# Patient Record
Sex: Male | Born: 1960 | Hispanic: No | Marital: Married | Smoking: Never smoker
Health system: Southern US, Community
[De-identification: ages and names within clinical notes are randomized; demographics above are authoritative.]

## PROBLEM LIST (undated history)

## (undated) DIAGNOSIS — I10 Essential (primary) hypertension: Secondary | ICD-10-CM

---

## 2006-09-28 ENCOUNTER — Ambulatory Visit: Payer: Self-pay | Admitting: Orthopedic Surgery

## 2008-06-26 IMAGING — CR ORBITS FOR FOREIGN BODY - 2 VIEW
1 series · 3 of 3 positions shown · non-contrast
Comparison: none

REASON FOR EXAM: Eval for metallic foreign body prior to MRI
COMMENTS:

PROCEDURE:     DXR - DXR ORBITS FOR MRI CLEARANCE  - September 28, 2006  [DATE]
RESULT:     Three views of the orbits were obtained prior to MRI
examination. No orbital foreign body or other significant adenopathy is seen
that would preclude MRI examination.

[Series 1: view not recorded · 0.17mm/px · 3 of 3 slices shown]
[im 1/3]
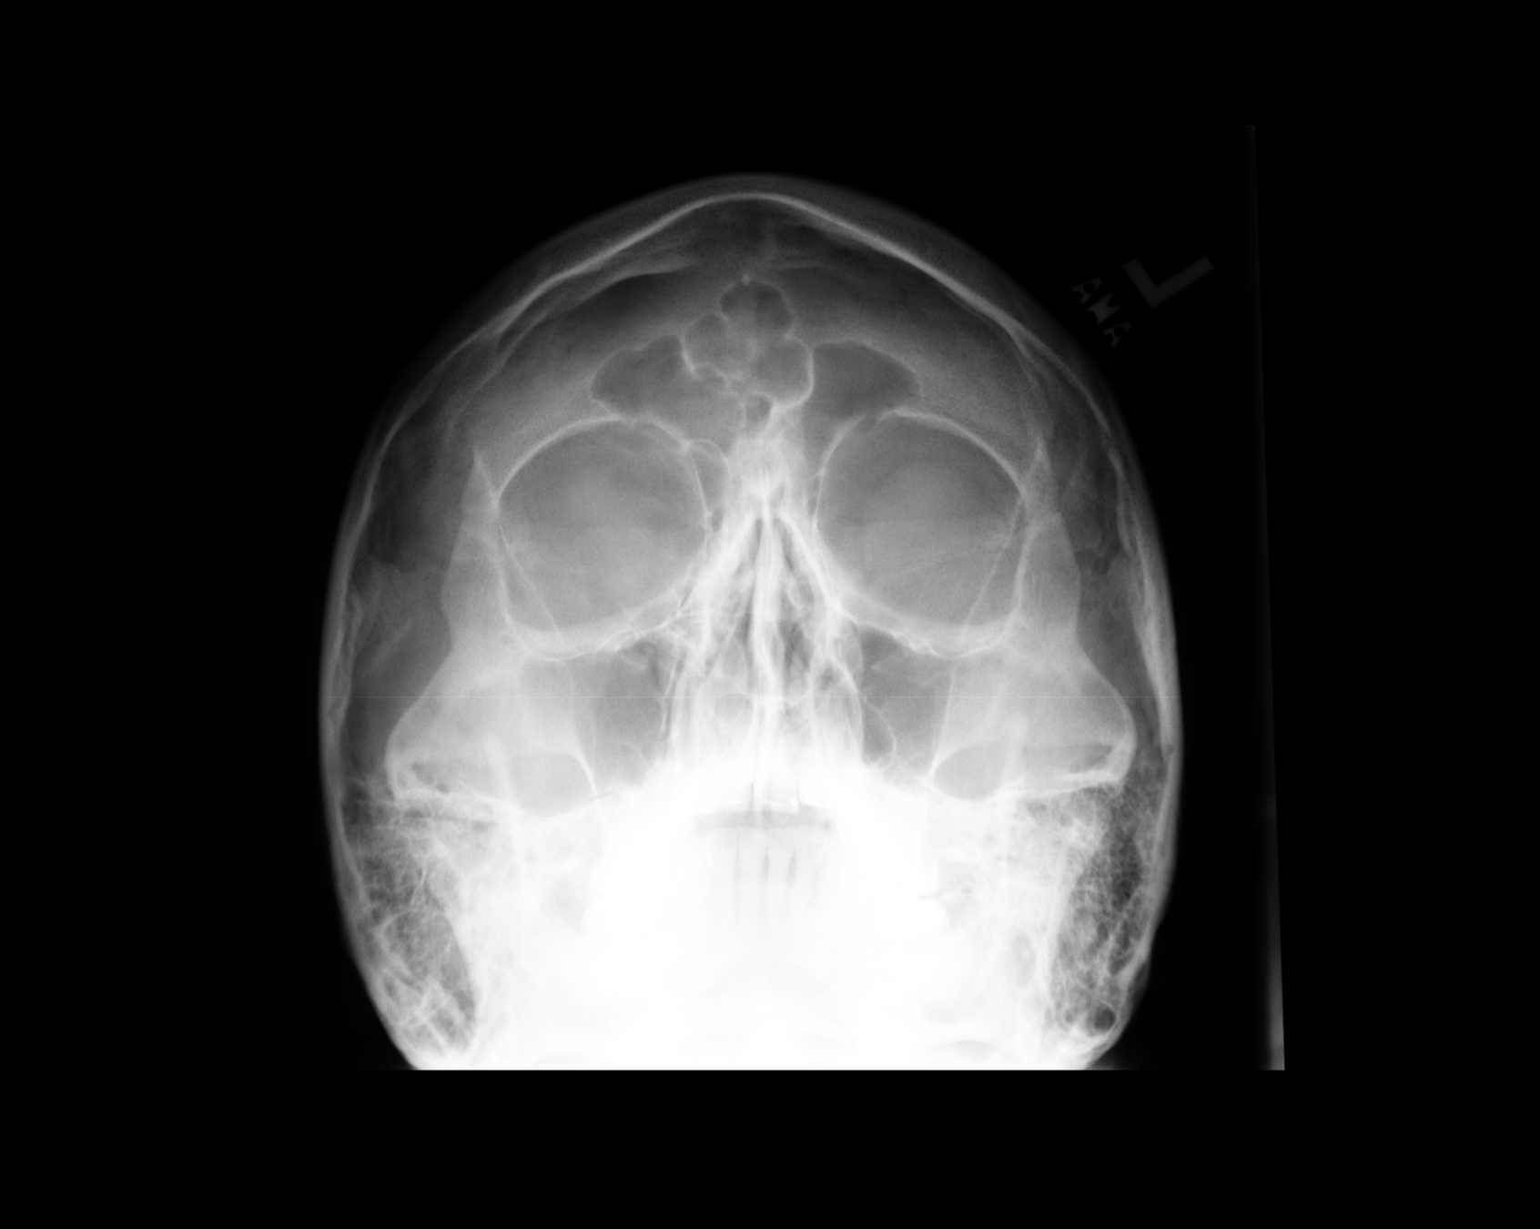
[im 2/3]
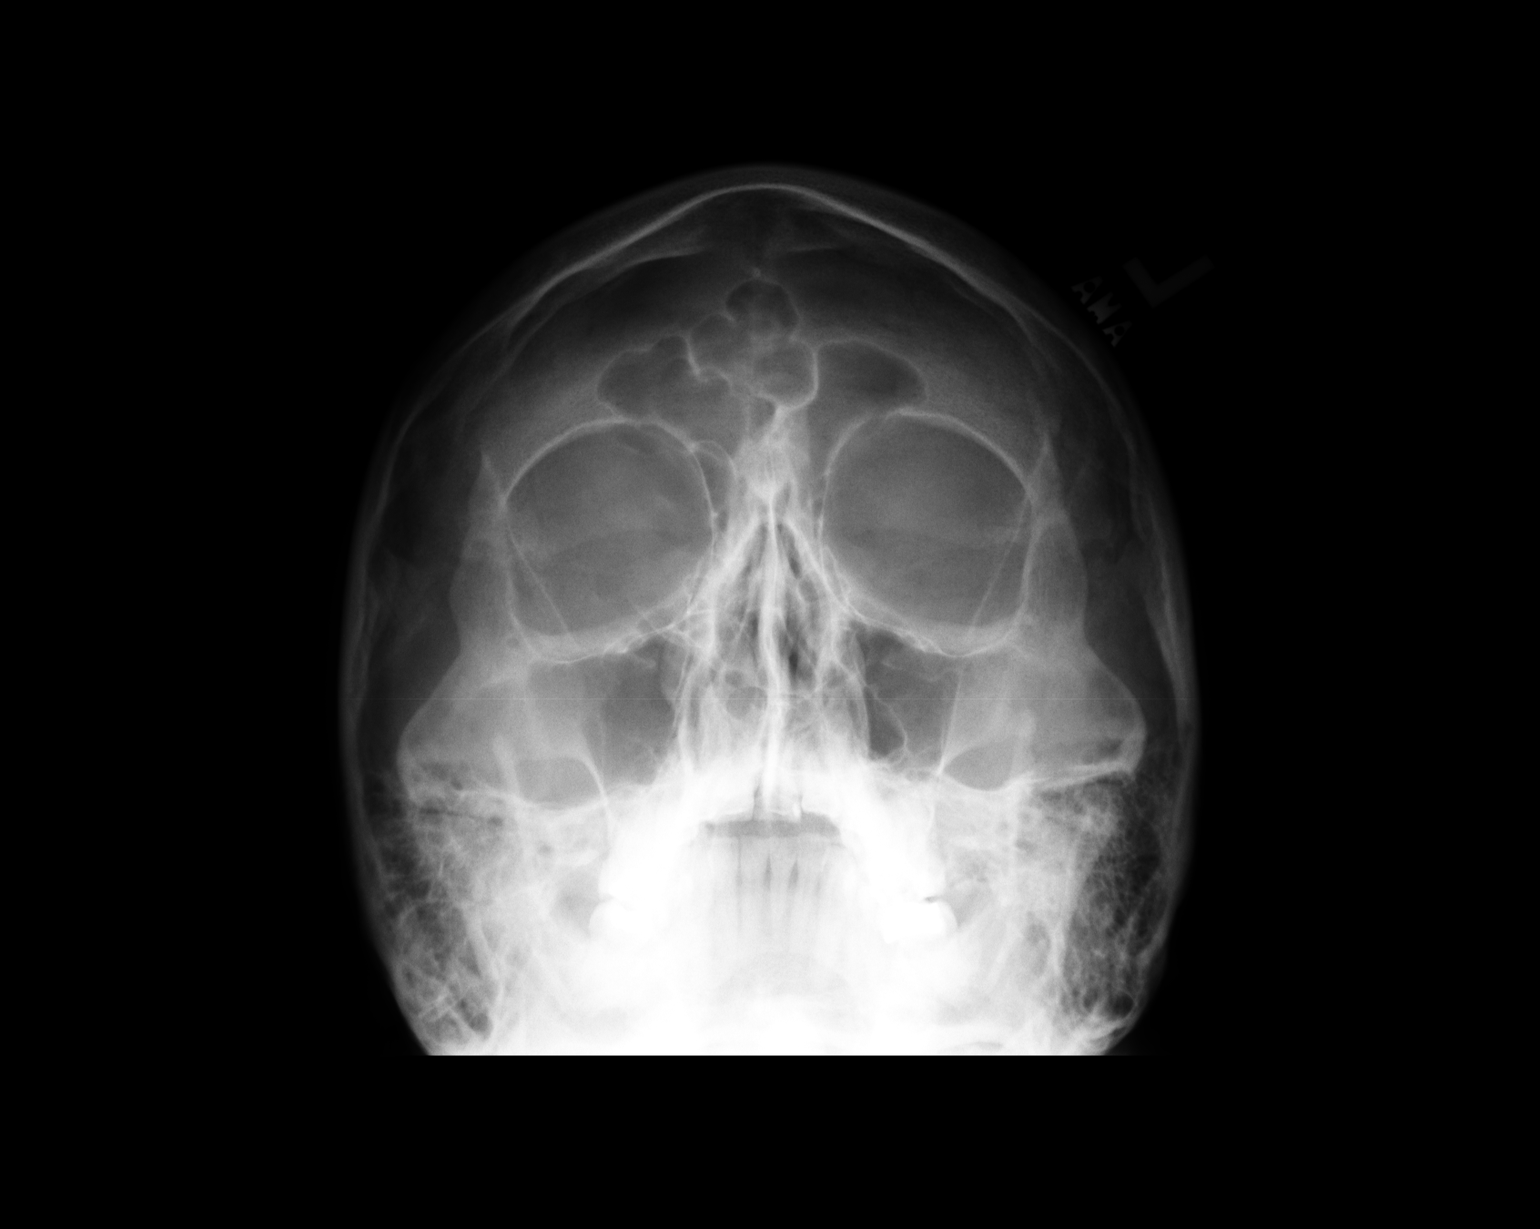
[im 3/3]
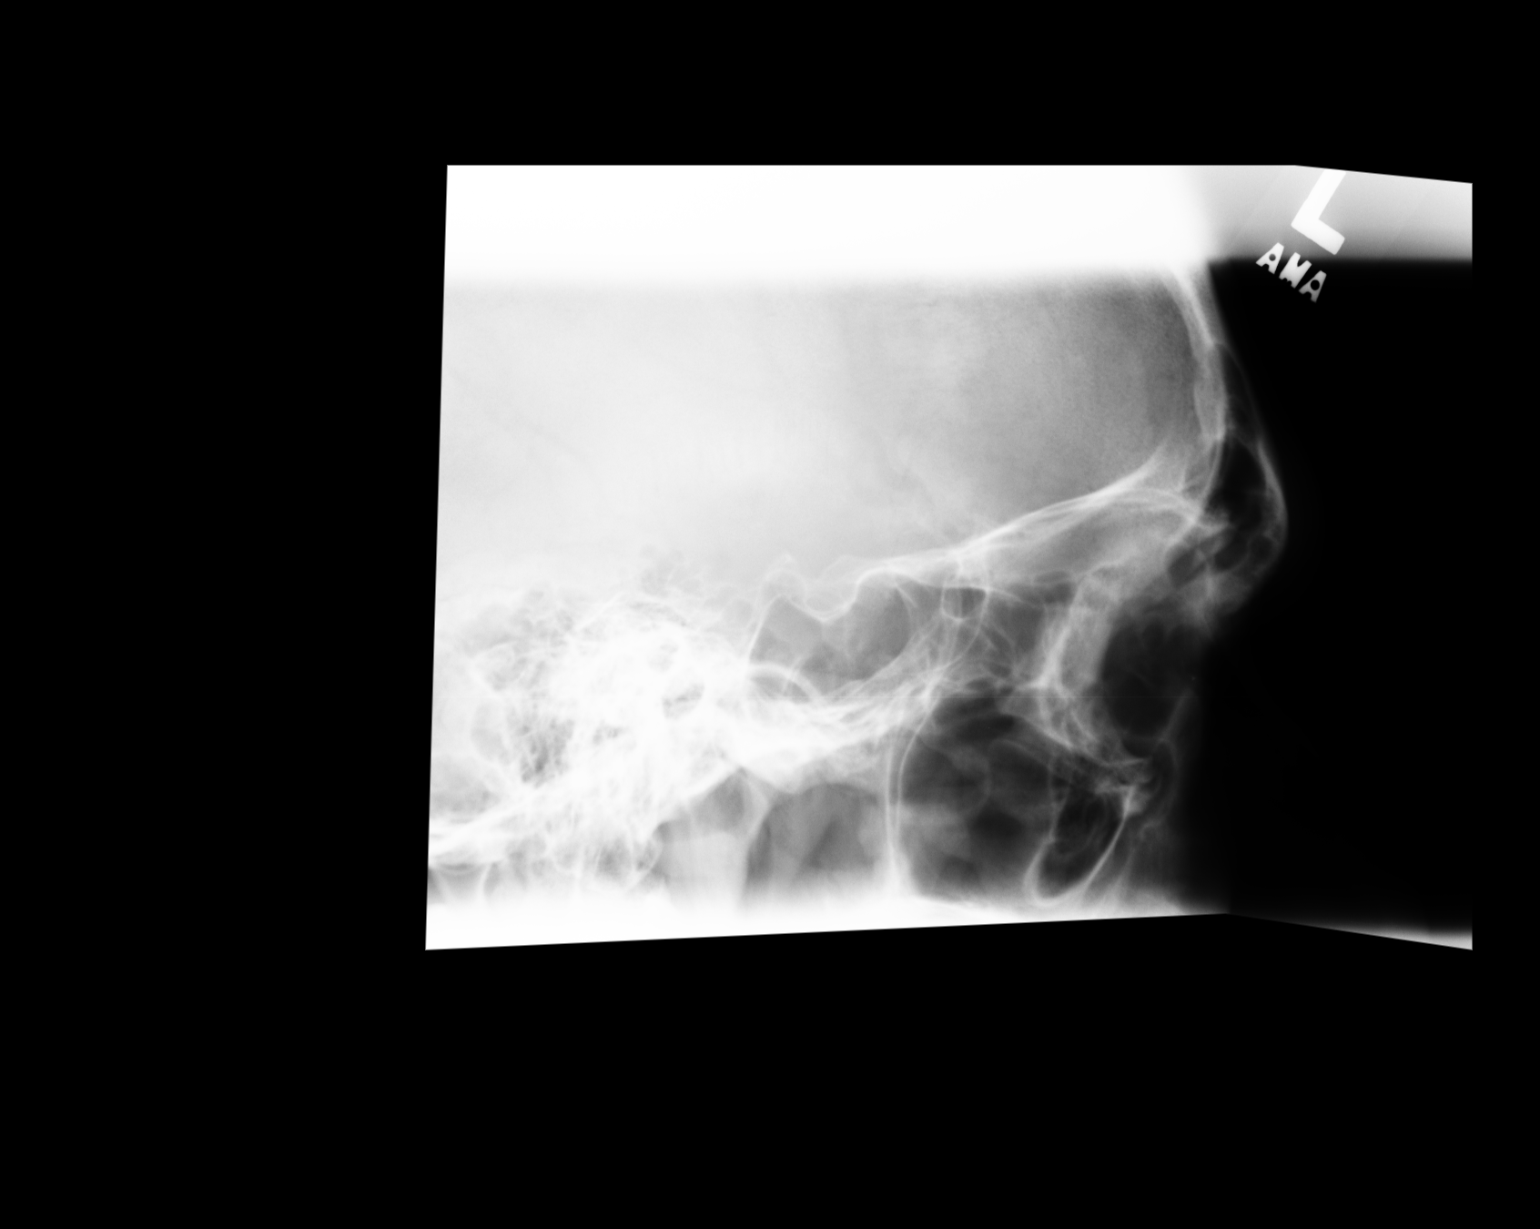

[3 of 3 positions shown; findings below may reference images not displayed]

IMPRESSION: 1.     Normal study.

## 2012-02-24 ENCOUNTER — Emergency Department: Payer: Self-pay | Admitting: Emergency Medicine

## 2012-02-27 ENCOUNTER — Emergency Department: Payer: Self-pay | Admitting: Emergency Medicine

## 2012-03-02 ENCOUNTER — Emergency Department: Payer: Self-pay | Admitting: Emergency Medicine

## 2012-03-09 ENCOUNTER — Emergency Department: Payer: Self-pay | Admitting: Emergency Medicine

## 2014-09-16 ENCOUNTER — Ambulatory Visit: Payer: Self-pay | Admitting: Physician Assistant

## 2014-09-30 ENCOUNTER — Ambulatory Visit: Payer: Self-pay | Admitting: Family Medicine

## 2017-12-28 ENCOUNTER — Encounter: Payer: Self-pay | Admitting: Emergency Medicine

## 2017-12-28 ENCOUNTER — Other Ambulatory Visit: Payer: Self-pay

## 2017-12-28 ENCOUNTER — Ambulatory Visit
Admission: EM | Admit: 2017-12-28 | Discharge: 2017-12-28 | Disposition: A | Discharge status: Home / Self Care | Payer: Managed Care, Other (non HMO) | Attending: Emergency Medicine | Admitting: Emergency Medicine

## 2017-12-28 DIAGNOSIS — H1031 Unspecified acute conjunctivitis, right eye: Secondary | ICD-10-CM | POA: Diagnosis not present

## 2017-12-28 DIAGNOSIS — H109 Unspecified conjunctivitis: Secondary | ICD-10-CM

## 2017-12-28 HISTORY — DX: Essential (primary) hypertension: I10

## 2017-12-28 MED ORDER — FLUORESCEIN SODIUM 1 MG OP STRP
1.0000 | ORAL_STRIP | Freq: Once | OPHTHALMIC | Status: AC
  Administered 2017-12-28: 1 via OPHTHALMIC

## 2017-12-28 MED ORDER — MOXIFLOXACIN HCL 0.5 % OP SOLN: 1.0000 [drp] | Freq: Three times a day (TID) | OPHTHALMIC | 0 refills | Status: AC

## 2017-12-28 MED ORDER — TETRACAINE HCL 0.5 % OP SOLN
2.0000 [drp] | Freq: Once | OPHTHALMIC | Status: AC
  Administered 2017-12-28: 2 [drp] via OPHTHALMIC

## 2017-12-28 NOTE — Discharge Instructions (Addendum)
Try cool compresses.  Systane lubricating eyedrops as much as you want.  Try the Vigamox eyedrops and if you do not improve in several days, go see your eye doctor.  Do not wear your contact lens while using the Vigamox.  Immediately to the ER for headache, if you start seeing halos around lights, eye pain worse in the dark, fevers above 100.4.

## 2017-12-28 NOTE — ED Provider Notes (Signed)
HPI  SUBJECTIVE:  Devin Lin is a 57 y.o. male who presents with right eye redness starting yesterday.  He reports sore, throbbing, constant pain mild photophobia in this eye.  He reports increased tearing and reports discharge this morning.  He states that it feels like his eye is "scratched".  He switched out his contacts 3 days ago.  No nausea, vomiting, fevers, facial rash.  No erythema, edema surrounding the eye.  No headache, visual changes, halos around lights.  No foreign body sensation.  No eye pain worse in the dark.  No trauma to the eye.  No sick contacts.  No URI/allergy symptoms.  He states that he sleeps in his contacts.  States that he uses his right eye for reading close up, and cannot see far distances with it.  has a past medical history of hypertension, hypercholesterolemia.  No history of glaucoma, diabetes, sickle cell disease.  Ophthalmology: Shanon Rosser remember.  PMD: Dr. Jeanie Cooks.  Past Medical History:  Diagnosis Date  . Hypertension     History reviewed. No pertinent surgical history.  Family History  Problem Relation Age of Onset  . Hypertension Mother   . Hypertension Father     Social History   Tobacco Use  . Smoking status: Never Smoker  . Smokeless tobacco: Never Used  Substance Use Topics  . Alcohol use: Yes  . Drug use: Never    No current facility-administered medications for this encounter.   Current Outpatient Medications:  .  amLODipine-atorvastatin (CADUET) 5-10 MG tablet, Take 1 tablet by mouth daily., Disp: , Rfl:  .  atorvastatin (LIPITOR) 20 MG tablet, Take 20 mg by mouth daily., Disp: , Rfl:  .  Dexlansoprazole 30 MG capsule, Take 30 mg by mouth daily., Disp: , Rfl:  .  moxifloxacin (VIGAMOX) 0.5 % ophthalmic solution, Place 1 drop into both eyes 3 (three) times daily. X 7 days, Disp: 3 mL, Rfl: 0  No Known Allergies   ROS  As noted in HPI.   Physical Exam  BP 140/80 (BP Location: Left Arm)   Pulse 81   Temp  98.2 F (36.8 C) (Oral)   Resp 16   Ht 5\' 10"  (1.778 m)   Wt 190 lb (86.2 kg)   SpO2 98%   BMI 27.26 kg/m   Constitutional: Well developed, well nourished, no acute distress Eyes: PERRLA, EOMI, conjunctival injection right eye.  No direct or consensual photophobia.  No  foreign body seen under the lid on magnification.  No corneal ulcer.  No corneal abrasion.  No corneal foreign body.  No periorbital erythema, edema.  No hyphema.   Visual Acuity  Right Eye Distance: 20/200 Left Eye Distance: 20/40 Bilateral Distance:    Right Eye Near:   Left Eye Near:    Bilateral Near:    HENT: Normocephalic, atraumatic,mucus membranes moist Respiratory: Normal inspiratory effort Cardiovascular: Normal rate GI: nondistended skin: No facial rash, skin intact Musculoskeletal: no deformities Neurologic: Alert & oriented x 3, no focal neuro deficits Psychiatric: Speech and behavior appropriate   ED Course   Medications  tetracaine (PONTOCAINE) 0.5 % ophthalmic solution 2 drop (2 drops Right Eye Given 12/28/17 1515)  fluorescein ophthalmic strip 1 strip (1 strip Right Eye Given by Other 12/28/17 1533)    No orders of the defined types were placed in this encounter.   No results found for this or any previous visit (from the past 24 hour(s)). No results found.  ED Clinical Impression  Conjunctivitis of  right eye, unspecified conjunctivitis type   ED Assessment/Plan  Visual acuity noted.  Patient states that this inequality is normal for him.  Doubt glaucoma, no evidence of corneal abrasion/ulcer, corneal foreign body.  No evidence of iritis.  Unsure as the etiology of patient's symptoms, but I do not think that it is allergies.  I suspect that it could be infectious.  So we will send home with Vigamox eyedrops.  He will follow-up with his ophthalmologist in several days if he does not improve with this.  Discussed MDM, treatment plan, and plan for follow-up with patient. Discussed sn/sx  that should prompt return to the ED. patient agrees with plan.   Meds ordered this encounter  Medications  . tetracaine (PONTOCAINE) 0.5 % ophthalmic solution 2 drop  . fluorescein ophthalmic strip 1 strip  . moxifloxacin (VIGAMOX) 0.5 % ophthalmic solution    Sig: Place 1 drop into both eyes 3 (three) times daily. X 7 days    Dispense:  3 mL    Refill:  0    *This clinic note was created using Scient[employer], clinical (histocompatibility and immunogenetics)Dragon dictation software. Therefore, there may be occasional mistakes despite careful proofreading.   ?   Domenick GongMortenson, Denzil Mceachron, MD 12/28/17 1535

## 2017-12-28 NOTE — ED Triage Notes (Signed)
Patient states his right eye became irritated yesterday and has gotten progressively worse today
